# Patient Record
Sex: Female | Born: 1950 | Race: Black or African American | Hispanic: No | Marital: Married
Health system: Southern US, Community
[De-identification: ages and names within clinical notes are randomized; demographics above are authoritative.]

## PROBLEM LIST (undated history)

## (undated) DIAGNOSIS — E079 Disorder of thyroid, unspecified: Secondary | ICD-10-CM

## (undated) HISTORY — PX: BREAST EXCISIONAL BIOPSY: SUR124

---

## 2012-04-10 ENCOUNTER — Other Ambulatory Visit: Payer: Self-pay | Admitting: "Endocrinology

## 2012-04-10 DIAGNOSIS — Z1231 Encounter for screening mammogram for malignant neoplasm of breast: Secondary | ICD-10-CM

## 2012-04-24 ENCOUNTER — Ambulatory Visit: Payer: Self-pay

## 2012-05-14 ENCOUNTER — Ambulatory Visit
Admission: RE | Admit: 2012-05-14 | Discharge: 2012-05-14 | Disposition: A | Discharge status: Home / Self Care | Payer: BC Managed Care – PPO | Source: Ambulatory Visit | Attending: "Endocrinology | Admitting: "Endocrinology

## 2012-05-14 DIAGNOSIS — Z1231 Encounter for screening mammogram for malignant neoplasm of breast: Secondary | ICD-10-CM

## 2013-05-12 ENCOUNTER — Other Ambulatory Visit: Payer: Self-pay

## 2013-05-12 DIAGNOSIS — Z1231 Encounter for screening mammogram for malignant neoplasm of breast: Secondary | ICD-10-CM

## 2013-06-04 ENCOUNTER — Ambulatory Visit
Admission: RE | Admit: 2013-06-04 | Discharge: 2013-06-04 | Disposition: A | Discharge status: Home / Self Care | Payer: BC Managed Care – PPO | Source: Ambulatory Visit

## 2013-06-04 DIAGNOSIS — Z1231 Encounter for screening mammogram for malignant neoplasm of breast: Secondary | ICD-10-CM

## 2013-06-07 ENCOUNTER — Other Ambulatory Visit: Payer: Self-pay | Admitting: "Endocrinology

## 2013-06-07 DIAGNOSIS — R928 Other abnormal and inconclusive findings on diagnostic imaging of breast: Secondary | ICD-10-CM

## 2013-06-14 ENCOUNTER — Ambulatory Visit
Admission: RE | Admit: 2013-06-14 | Discharge: 2013-06-14 | Disposition: A | Discharge status: Home / Self Care | Payer: BC Managed Care – PPO | Source: Ambulatory Visit | Attending: "Endocrinology | Admitting: "Endocrinology

## 2013-06-14 DIAGNOSIS — R928 Other abnormal and inconclusive findings on diagnostic imaging of breast: Secondary | ICD-10-CM

## 2014-04-25 ENCOUNTER — Other Ambulatory Visit: Payer: Self-pay

## 2014-04-25 DIAGNOSIS — Z1231 Encounter for screening mammogram for malignant neoplasm of breast: Secondary | ICD-10-CM

## 2014-06-15 ENCOUNTER — Ambulatory Visit
Admission: RE | Admit: 2014-06-15 | Discharge: 2014-06-15 | Disposition: A | Discharge status: Home / Self Care | Payer: BC Managed Care – PPO | Source: Ambulatory Visit

## 2014-06-15 ENCOUNTER — Encounter (INDEPENDENT_AMBULATORY_CARE_PROVIDER_SITE_OTHER): Payer: Self-pay

## 2014-06-15 DIAGNOSIS — Z1231 Encounter for screening mammogram for malignant neoplasm of breast: Secondary | ICD-10-CM

## 2015-06-22 ENCOUNTER — Other Ambulatory Visit: Payer: Self-pay

## 2015-06-22 DIAGNOSIS — Z1231 Encounter for screening mammogram for malignant neoplasm of breast: Secondary | ICD-10-CM

## 2015-07-25 ENCOUNTER — Ambulatory Visit
Admission: RE | Admit: 2015-07-25 | Discharge: 2015-07-25 | Disposition: A | Discharge status: Home / Self Care | Payer: BLUE CROSS/BLUE SHIELD | Source: Ambulatory Visit

## 2015-07-25 DIAGNOSIS — Z1231 Encounter for screening mammogram for malignant neoplasm of breast: Secondary | ICD-10-CM

## 2016-07-01 ENCOUNTER — Other Ambulatory Visit: Payer: Self-pay | Admitting: Family Medicine

## 2016-07-01 DIAGNOSIS — Z1231 Encounter for screening mammogram for malignant neoplasm of breast: Secondary | ICD-10-CM

## 2016-07-29 ENCOUNTER — Ambulatory Visit
Admission: RE | Admit: 2016-07-29 | Discharge: 2016-07-29 | Disposition: A | Discharge status: Home / Self Care | Payer: BLUE CROSS/BLUE SHIELD | Source: Ambulatory Visit | Attending: Family Medicine | Admitting: Family Medicine

## 2016-07-29 DIAGNOSIS — Z1231 Encounter for screening mammogram for malignant neoplasm of breast: Secondary | ICD-10-CM

## 2017-07-07 ENCOUNTER — Other Ambulatory Visit: Payer: Self-pay | Admitting: Internal Medicine

## 2017-07-07 DIAGNOSIS — Z1231 Encounter for screening mammogram for malignant neoplasm of breast: Secondary | ICD-10-CM

## 2017-07-30 ENCOUNTER — Ambulatory Visit
Admission: RE | Admit: 2017-07-30 | Discharge: 2017-07-30 | Disposition: A | Discharge status: Home / Self Care | Payer: Medicare Other | Source: Ambulatory Visit | Attending: Internal Medicine | Admitting: Internal Medicine

## 2017-07-30 DIAGNOSIS — Z1231 Encounter for screening mammogram for malignant neoplasm of breast: Secondary | ICD-10-CM

## 2018-09-08 ENCOUNTER — Other Ambulatory Visit: Payer: Self-pay | Admitting: Internal Medicine

## 2018-09-08 DIAGNOSIS — Z1231 Encounter for screening mammogram for malignant neoplasm of breast: Secondary | ICD-10-CM

## 2018-10-16 ENCOUNTER — Ambulatory Visit
Admission: RE | Admit: 2018-10-16 | Discharge: 2018-10-16 | Disposition: A | Discharge status: Home / Self Care | Payer: Medicare Other | Source: Ambulatory Visit | Attending: Internal Medicine | Admitting: Internal Medicine

## 2018-10-16 DIAGNOSIS — Z1231 Encounter for screening mammogram for malignant neoplasm of breast: Secondary | ICD-10-CM

## 2019-10-14 ENCOUNTER — Ambulatory Visit: Payer: Medicare Other | Attending: Internal Medicine

## 2019-10-14 DIAGNOSIS — Z23 Encounter for immunization: Secondary | ICD-10-CM | POA: Insufficient documentation

## 2019-10-19 ENCOUNTER — Ambulatory Visit: Payer: Medicare Other

## 2019-11-02 ENCOUNTER — Other Ambulatory Visit: Payer: Self-pay | Admitting: Internal Medicine

## 2019-11-02 DIAGNOSIS — Z1231 Encounter for screening mammogram for malignant neoplasm of breast: Secondary | ICD-10-CM

## 2019-11-05 ENCOUNTER — Ambulatory Visit: Payer: Medicare Other

## 2019-11-05 ENCOUNTER — Ambulatory Visit: Payer: Medicare Other | Attending: Internal Medicine

## 2019-11-05 DIAGNOSIS — Z23 Encounter for immunization: Secondary | ICD-10-CM | POA: Insufficient documentation

## 2019-11-05 NOTE — Progress Notes (Signed)
   Covid-19 Vaccination Clinic  Name:  [name]    MRN: [id number] DOB: 1950-12-01  11/05/2019  Miranda Le was observed post Covid-19 immunization for 15 minutes without incidence. She was provided with Vaccine Information Sheet and instruction to access the V-Safe system.   [name] was instructed to call 911 with any severe reactions post vaccine: Marland Kitchen Difficulty breathing  . Swelling of your face and throat  . A fast heartbeat  . A bad rash all over your body  . Dizziness and weakness    Immunizations Administered    Name Date Dose VIS Date Route   Pfizer COVID-19 Vaccine 11/05/2019  8:41 AM 0.3 mL 09/03/2019 Intramuscular   Manufacturer: [employer], [employer]   Lot: [id number]   NDC: [id number]

## 2019-12-08 ENCOUNTER — Other Ambulatory Visit: Payer: Self-pay

## 2019-12-08 ENCOUNTER — Ambulatory Visit
Admission: RE | Admit: 2019-12-08 | Discharge: 2019-12-08 | Disposition: A | Discharge status: Home / Self Care | Payer: Medicare Other | Source: Ambulatory Visit | Attending: Internal Medicine | Admitting: Internal Medicine

## 2019-12-08 DIAGNOSIS — Z1231 Encounter for screening mammogram for malignant neoplasm of breast: Secondary | ICD-10-CM

## 2019-12-10 ENCOUNTER — Other Ambulatory Visit: Payer: Self-pay | Admitting: Internal Medicine

## 2019-12-10 DIAGNOSIS — R928 Other abnormal and inconclusive findings on diagnostic imaging of breast: Secondary | ICD-10-CM

## 2019-12-28 ENCOUNTER — Other Ambulatory Visit: Payer: Self-pay

## 2019-12-28 ENCOUNTER — Ambulatory Visit
Admission: RE | Admit: 2019-12-28 | Discharge: 2019-12-28 | Disposition: A | Discharge status: Home / Self Care | Payer: Medicare Other | Source: Ambulatory Visit | Attending: Internal Medicine | Admitting: Internal Medicine

## 2019-12-28 DIAGNOSIS — R928 Other abnormal and inconclusive findings on diagnostic imaging of breast: Secondary | ICD-10-CM

## 2020-11-28 ENCOUNTER — Other Ambulatory Visit: Payer: Self-pay | Admitting: Internal Medicine

## 2020-11-28 DIAGNOSIS — Z1231 Encounter for screening mammogram for malignant neoplasm of breast: Secondary | ICD-10-CM

## 2021-01-22 ENCOUNTER — Other Ambulatory Visit: Payer: Self-pay

## 2021-01-22 ENCOUNTER — Ambulatory Visit
Admission: RE | Admit: 2021-01-22 | Discharge: 2021-01-22 | Disposition: A | Discharge status: Home / Self Care | Payer: Medicare Other | Source: Ambulatory Visit | Attending: Internal Medicine | Admitting: Internal Medicine

## 2021-01-22 DIAGNOSIS — Z1231 Encounter for screening mammogram for malignant neoplasm of breast: Secondary | ICD-10-CM

## 2021-12-31 ENCOUNTER — Other Ambulatory Visit: Payer: Self-pay | Admitting: Internal Medicine

## 2021-12-31 DIAGNOSIS — Z1231 Encounter for screening mammogram for malignant neoplasm of breast: Secondary | ICD-10-CM

## 2022-01-23 ENCOUNTER — Ambulatory Visit
Admission: RE | Admit: 2022-01-23 | Discharge: 2022-01-23 | Disposition: A | Discharge status: Home / Self Care | Payer: Medicare Other | Source: Ambulatory Visit | Attending: Internal Medicine | Admitting: Internal Medicine

## 2022-01-23 DIAGNOSIS — Z1231 Encounter for screening mammogram for malignant neoplasm of breast: Secondary | ICD-10-CM

## 2022-05-18 IMAGING — MG MM DIGITAL SCREENING BILAT W/ TOMO AND CAD
8 series · 9 of 24 positions shown · non-contrast
Comparison: Previous exam(s).

CLINICAL DATA: Screening.

EXAM:
DIGITAL SCREENING BILATERAL MAMMOGRAM WITH TOMOSYNTHESIS AND CAD
TECHNIQUE: Bilateral screening digital craniocaudal and mediolateral oblique
mammograms were obtained. Bilateral screening digital breast
tomosynthesis was performed. The images were evaluated with
computer-aided detection.

[L MLO synth-2D]
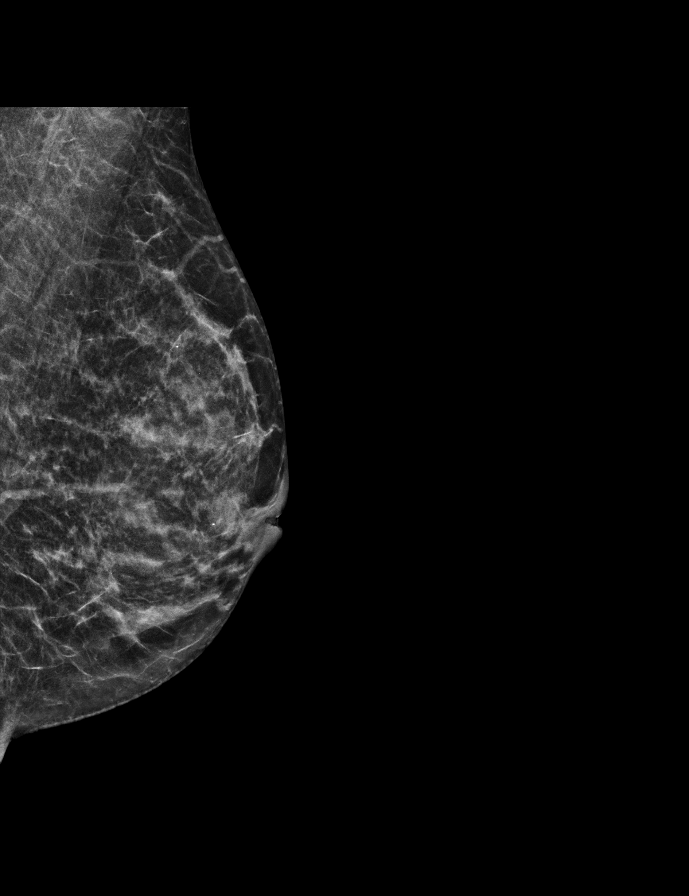

[R MLO synth-2D]
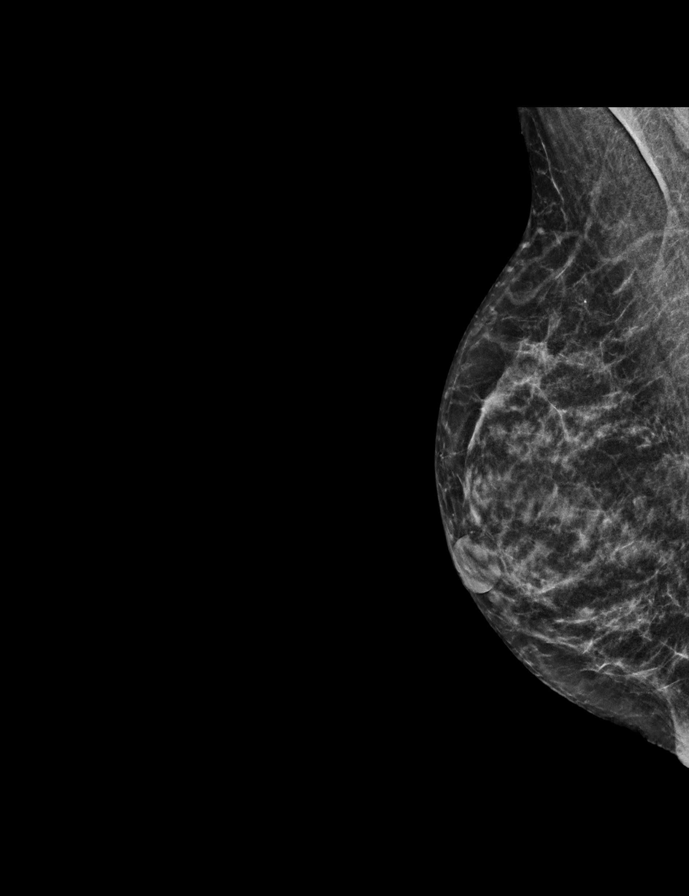

[R CC synth-2D]
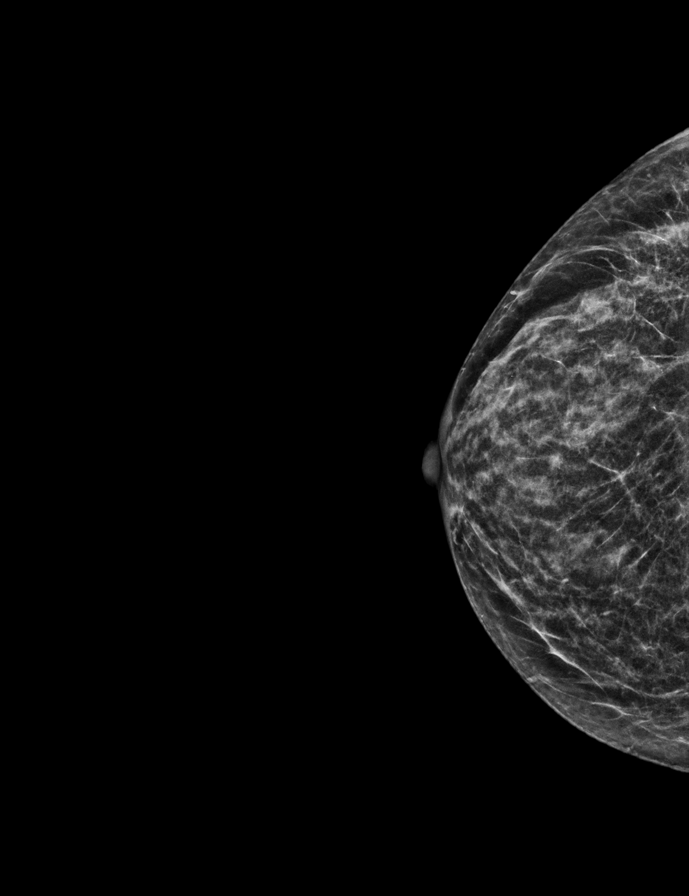

[L CC synth-2D]
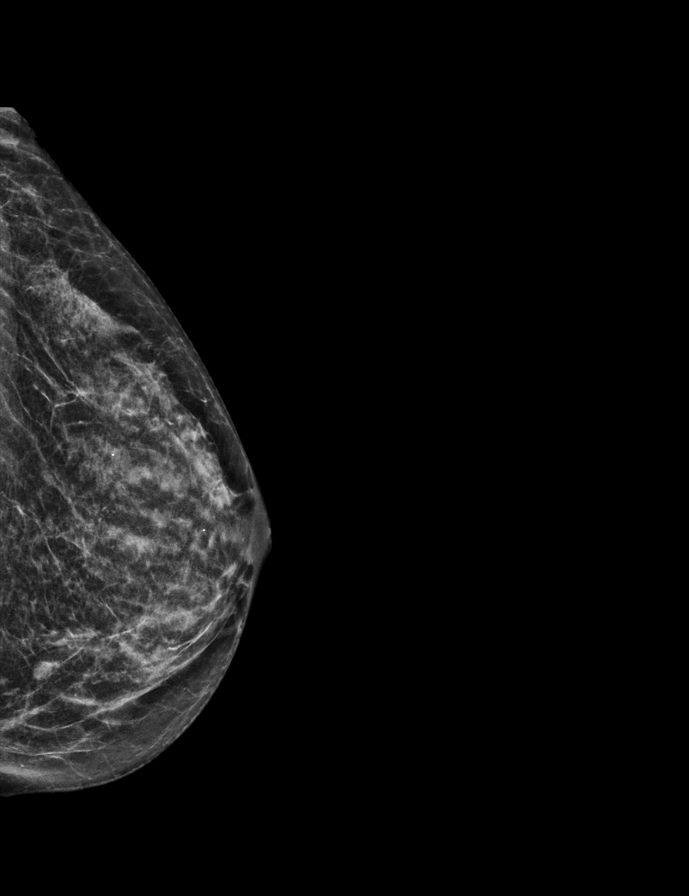

[R CC tomo · 2 of 39 frames shown]
[frame 13/39]
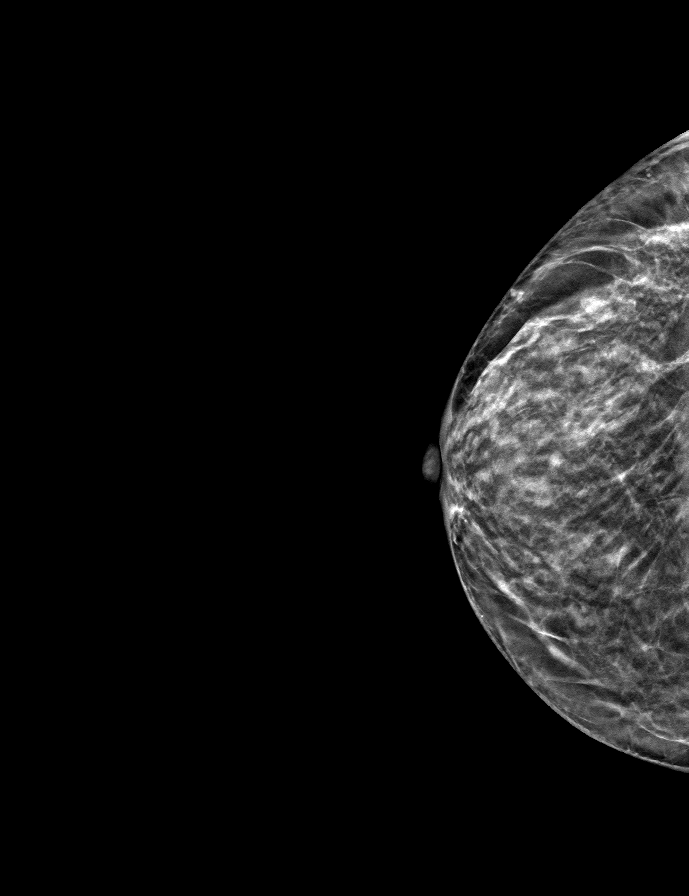
[frame 20/39]
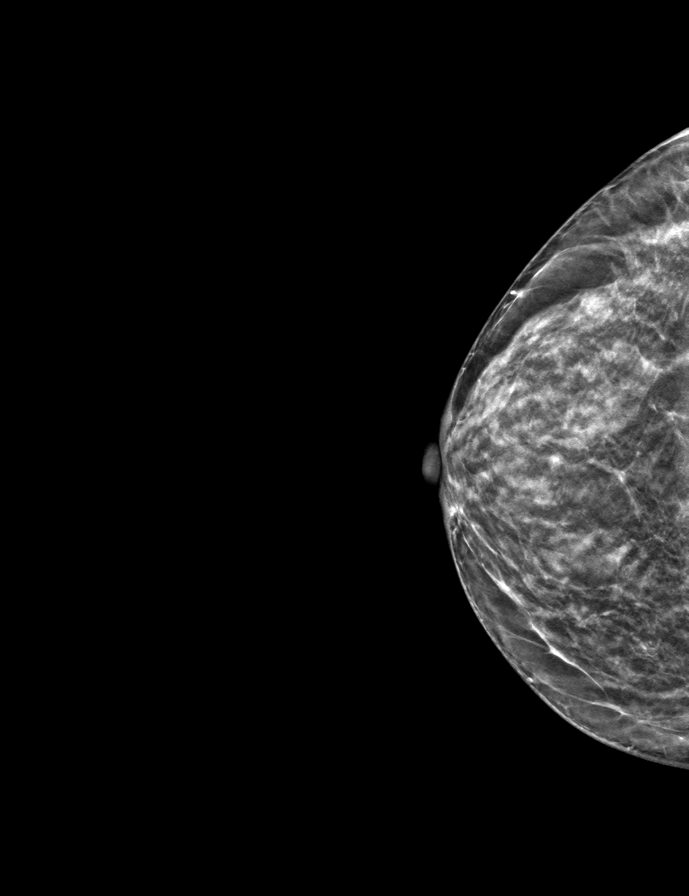

[R MLO tomo · tomo slice 23/45.0]
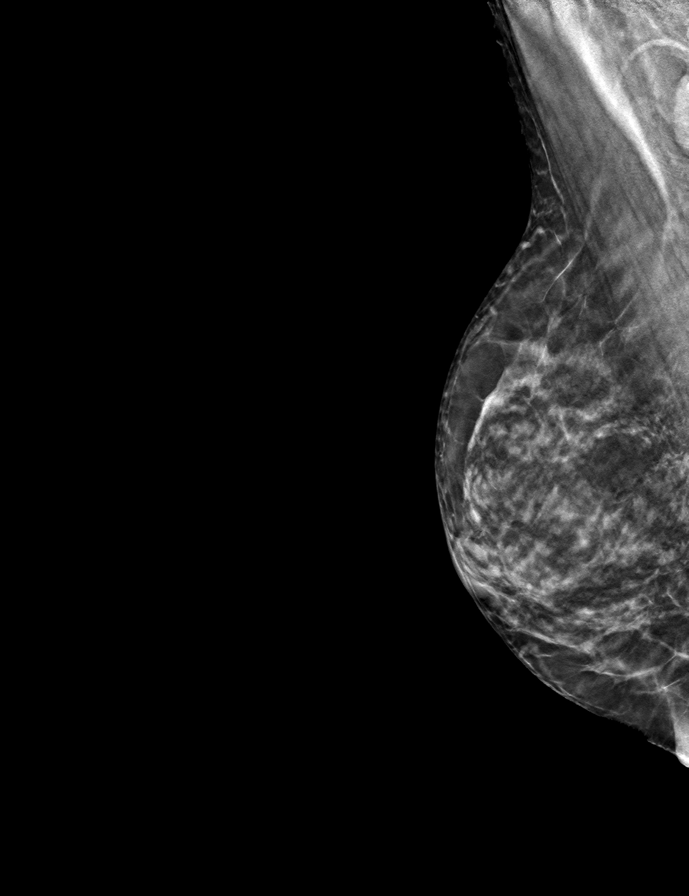

[L MLO tomo · tomo slice 21/42.0]
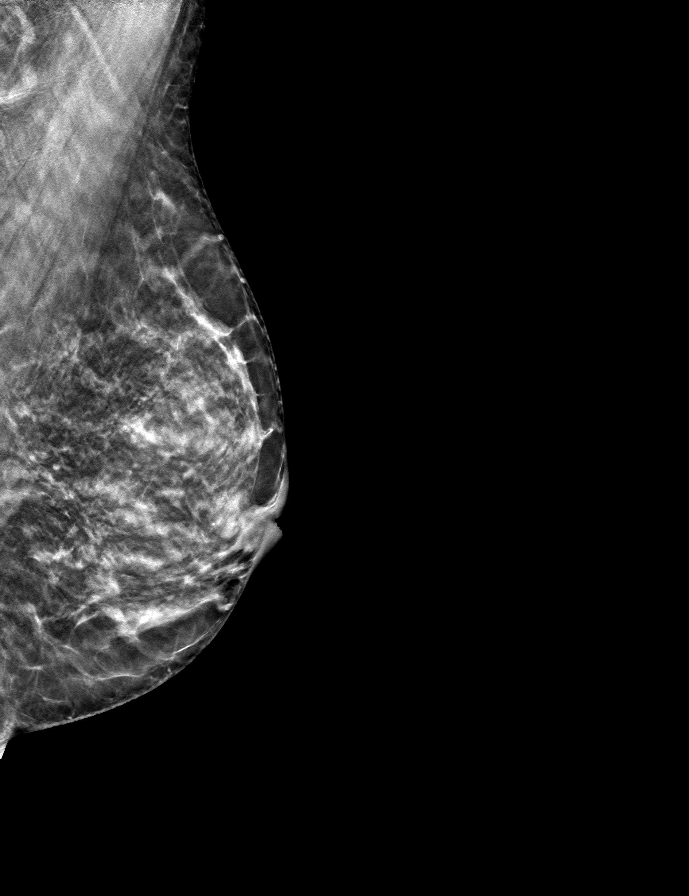

[L CC tomo · tomo slice 21/41.0]
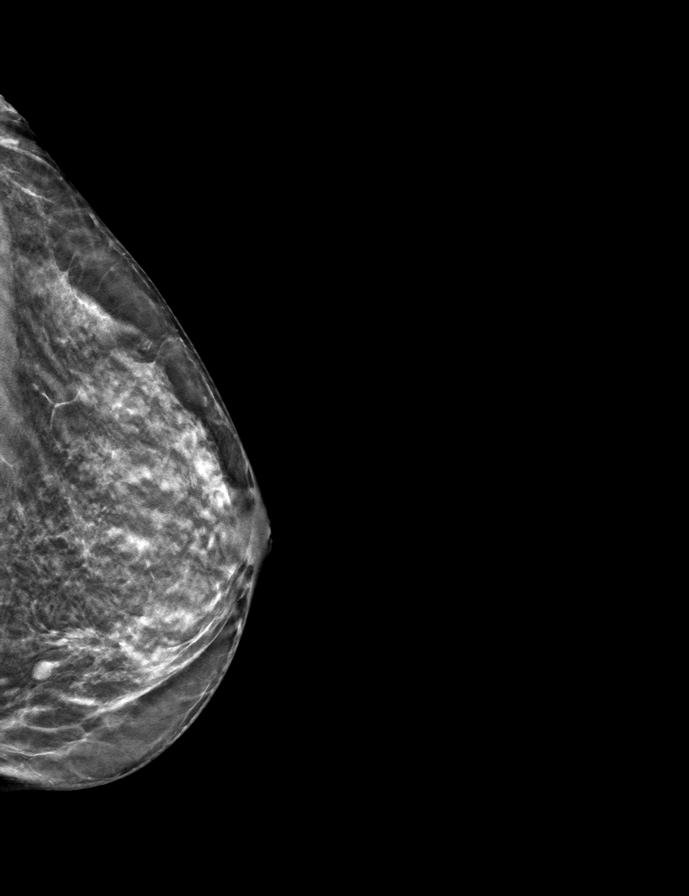

[9 of 24 positions shown; findings below may reference images not displayed]

ACR Breast Density Category c: The breast tissue is heterogeneously
dense, which may obscure small masses.
FINDINGS: There are no findings suspicious for malignancy.
IMPRESSION: No mammographic evidence of malignancy. A result letter of this
screening mammogram will be mailed directly to the patient.

RECOMMENDATION:
Screening mammogram in one year. (Code:Q3-[id number])

BI-RADS CATEGORY  1: Negative.

## 2022-12-13 ENCOUNTER — Other Ambulatory Visit: Payer: Self-pay | Admitting: Internal Medicine

## 2022-12-13 DIAGNOSIS — Z1231 Encounter for screening mammogram for malignant neoplasm of breast: Secondary | ICD-10-CM

## 2023-01-30 ENCOUNTER — Ambulatory Visit
Admission: RE | Admit: 2023-01-30 | Discharge: 2023-01-30 | Disposition: A | Discharge status: Home / Self Care | Payer: Medicare Other | Source: Ambulatory Visit | Attending: Internal Medicine | Admitting: Internal Medicine

## 2023-01-30 DIAGNOSIS — Z1231 Encounter for screening mammogram for malignant neoplasm of breast: Secondary | ICD-10-CM

## 2024-01-05 ENCOUNTER — Other Ambulatory Visit: Payer: Self-pay | Admitting: Internal Medicine

## 2024-01-05 DIAGNOSIS — Z1231 Encounter for screening mammogram for malignant neoplasm of breast: Secondary | ICD-10-CM

## 2024-02-02 ENCOUNTER — Ambulatory Visit
Admission: RE | Admit: 2024-02-02 | Discharge: 2024-02-02 | Disposition: A | Discharge status: Home / Self Care | Source: Ambulatory Visit | Attending: Internal Medicine | Admitting: Internal Medicine

## 2024-02-02 DIAGNOSIS — Z1231 Encounter for screening mammogram for malignant neoplasm of breast: Secondary | ICD-10-CM

## 2024-02-04 ENCOUNTER — Emergency Department (HOSPITAL_BASED_OUTPATIENT_CLINIC_OR_DEPARTMENT_OTHER): Admitting: Radiology

## 2024-02-04 ENCOUNTER — Other Ambulatory Visit: Payer: Self-pay

## 2024-02-04 ENCOUNTER — Emergency Department (HOSPITAL_BASED_OUTPATIENT_CLINIC_OR_DEPARTMENT_OTHER)
Admission: EM | Admit: 2024-02-04 | Discharge: 2024-02-04 | Disposition: A | Discharge status: Home / Self Care | Attending: Emergency Medicine | Admitting: Emergency Medicine

## 2024-02-04 ENCOUNTER — Encounter (HOSPITAL_BASED_OUTPATIENT_CLINIC_OR_DEPARTMENT_OTHER): Payer: Self-pay | Admitting: Emergency Medicine

## 2024-02-04 ENCOUNTER — Emergency Department (HOSPITAL_BASED_OUTPATIENT_CLINIC_OR_DEPARTMENT_OTHER)

## 2024-02-04 DIAGNOSIS — R0602 Shortness of breath: Secondary | ICD-10-CM | POA: Diagnosis not present

## 2024-02-04 DIAGNOSIS — M25572 Pain in left ankle and joints of left foot: Secondary | ICD-10-CM | POA: Diagnosis not present

## 2024-02-04 DIAGNOSIS — R079 Chest pain, unspecified: Secondary | ICD-10-CM | POA: Insufficient documentation

## 2024-02-04 DIAGNOSIS — Y92410 Unspecified street and highway as the place of occurrence of the external cause: Secondary | ICD-10-CM | POA: Insufficient documentation

## 2024-02-04 DIAGNOSIS — S92252A Displaced fracture of navicular [scaphoid] of left foot, initial encounter for closed fracture: Secondary | ICD-10-CM | POA: Insufficient documentation

## 2024-02-04 DIAGNOSIS — S99912A Unspecified injury of left ankle, initial encounter: Secondary | ICD-10-CM | POA: Diagnosis present

## 2024-02-04 DIAGNOSIS — S0990XA Unspecified injury of head, initial encounter: Secondary | ICD-10-CM | POA: Diagnosis present

## 2024-02-04 HISTORY — DX: Disorder of thyroid, unspecified: E07.9

## 2024-02-04 LAB — CBC WITH DIFFERENTIAL/PLATELET
Abs Immature Granulocytes: 0.03 10*3/uL (ref 0.00–0.07)
Basophils Absolute: 0 10*3/uL (ref 0.0–0.1)
Basophils Relative: 1 %
Eosinophils Absolute: 0.1 10*3/uL (ref 0.0–0.5)
Eosinophils Relative: 1 %
HCT: 42.6 % (ref 36.0–46.0)
Hemoglobin: 14.2 g/dL (ref 12.0–15.0)
Immature Granulocytes: 0 %
Lymphocytes Relative: 21 %
Lymphs Abs: 1.5 10*3/uL (ref 0.7–4.0)
MCH: 30 pg (ref 26.0–34.0)
MCHC: 33.3 g/dL (ref 30.0–36.0)
MCV: 89.9 fL (ref 80.0–100.0)
Monocytes Absolute: 0.5 10*3/uL (ref 0.1–1.0)
Monocytes Relative: 7 %
Neutro Abs: 5.1 10*3/uL (ref 1.7–7.7)
Neutrophils Relative %: 70 %
Platelets: 287 10*3/uL (ref 150–400)
RBC: 4.74 MIL/uL (ref 3.87–5.11)
RDW: 13.2 % (ref 11.5–15.5)
WBC: 7.3 10*3/uL (ref 4.0–10.5)
nRBC: 0 % (ref 0.0–0.2)

## 2024-02-04 LAB — BASIC METABOLIC PANEL WITH GFR
Anion gap: 12 (ref 5–15)
BUN: 16 mg/dL (ref 8–23)
CO2: 26 mmol/L (ref 22–32)
Calcium: 9.8 mg/dL (ref 8.9–10.3)
Chloride: 101 mmol/L (ref 98–111)
Creatinine, Ser: 0.95 mg/dL (ref 0.44–1.00)
GFR, Estimated: >60 mL/min (ref >60)
Glucose, Bld: 121 mg/dL — ABNORMAL HIGH (ref 70–99)
Potassium: 4.1 mmol/L (ref 3.5–5.1)
Sodium: 138 mmol/L (ref 135–145)

## 2024-02-04 MED ORDER — MORPHINE SULFATE (PF) 2 MG/ML IV SOLN
2.0000 mg | Freq: Once | INTRAVENOUS | Status: AC
  Administered 2024-02-04: 2 mg via INTRAVENOUS
  Filled 2024-02-04: qty 1

## 2024-02-04 MED ORDER — CELECOXIB 200 MG PO CAPS: 200.0000 mg | ORAL_CAPSULE | Freq: Two times a day (BID) | ORAL | 0 refills | Status: AC

## 2024-02-04 MED ORDER — OXYCODONE-ACETAMINOPHEN 5-325 MG PO TABS
1.0000 | ORAL_TABLET | Freq: Four times a day (QID) | ORAL | 0 refills | Status: AC | PRN
Start: 2024-02-04 — End: ?

## 2024-02-04 NOTE — ED Notes (Signed)
 ED Provider at bedside.

## 2024-02-04 NOTE — ED Notes (Signed)
 Patient transported to CT
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
